# Patient Record
Sex: Female | Born: 1975 | Race: White | Hispanic: No | Marital: Single | Smoking: Never smoker
Health system: Southern US, Community
[De-identification: ages and names within clinical notes are randomized; demographics above are authoritative.]

## PROBLEM LIST (undated history)

## (undated) ENCOUNTER — Inpatient Hospital Stay (HOSPITAL_COMMUNITY): Payer: Self-pay

## (undated) DIAGNOSIS — Z789 Other specified health status: Secondary | ICD-10-CM

## (undated) HISTORY — PX: OTHER SURGICAL HISTORY: SHX169

---

## 2011-08-11 ENCOUNTER — Encounter: Payer: Self-pay | Admitting: Physician Assistant

## 2011-09-11 ENCOUNTER — Encounter: Payer: Self-pay | Admitting: Physician Assistant

## 2014-08-08 LAB — OB RESULTS CONSOLE HGB/HCT, BLOOD: HEMOGLOBIN: 10.5 g/dL

## 2014-08-08 LAB — OB RESULTS CONSOLE RPR: RPR: NONREACTIVE

## 2014-08-08 LAB — OB RESULTS CONSOLE HEPATITIS B SURFACE ANTIGEN: Hepatitis B Surface Ag: NEGATIVE

## 2014-08-08 LAB — OB RESULTS CONSOLE HIV ANTIBODY (ROUTINE TESTING): HIV: NONREACTIVE

## 2014-08-08 LAB — OB RESULTS CONSOLE ABO/RH: RH Type: POSITIVE

## 2014-09-04 ENCOUNTER — Other Ambulatory Visit (HOSPITAL_COMMUNITY): Payer: Self-pay | Admitting: Specialist

## 2014-09-04 DIAGNOSIS — O09292 Supervision of pregnancy with other poor reproductive or obstetric history, second trimester: Secondary | ICD-10-CM

## 2014-09-06 ENCOUNTER — Encounter (HOSPITAL_COMMUNITY): Payer: Self-pay | Admitting: *Deleted

## 2014-09-06 ENCOUNTER — Inpatient Hospital Stay (HOSPITAL_COMMUNITY)
Admission: AD | Admit: 2014-09-06 | Discharge: 2014-09-07 | Disposition: A | Discharge status: Home / Self Care | Source: Ambulatory Visit | Attending: Obstetrics & Gynecology | Admitting: Obstetrics & Gynecology

## 2014-09-06 DIAGNOSIS — F1990 Other psychoactive substance use, unspecified, uncomplicated: Secondary | ICD-10-CM | POA: Diagnosis present

## 2014-09-06 DIAGNOSIS — Z3A24 24 weeks gestation of pregnancy: Secondary | ICD-10-CM | POA: Diagnosis not present

## 2014-09-06 DIAGNOSIS — G4700 Insomnia, unspecified: Secondary | ICD-10-CM | POA: Diagnosis not present

## 2014-09-06 DIAGNOSIS — O99320 Drug use complicating pregnancy, unspecified trimester: Secondary | ICD-10-CM

## 2014-09-06 DIAGNOSIS — F1120 Opioid dependence, uncomplicated: Secondary | ICD-10-CM | POA: Diagnosis not present

## 2014-09-06 DIAGNOSIS — O99322 Drug use complicating pregnancy, second trimester: Secondary | ICD-10-CM | POA: Diagnosis not present

## 2014-09-06 DIAGNOSIS — Z3490 Encounter for supervision of normal pregnancy, unspecified, unspecified trimester: Secondary | ICD-10-CM

## 2014-09-06 LAB — RAPID URINE DRUG SCREEN, HOSP PERFORMED
Amphetamines: NOT DETECTED
Barbiturates: NOT DETECTED
Benzodiazepines: POSITIVE — AB
COCAINE: NOT DETECTED
Opiates: POSITIVE — AB
TETRAHYDROCANNABINOL: NOT DETECTED

## 2014-09-06 LAB — URINALYSIS, ROUTINE W REFLEX MICROSCOPIC
Bilirubin Urine: NEGATIVE
Glucose, UA: NEGATIVE mg/dL
Hgb urine dipstick: NEGATIVE
Ketones, ur: NEGATIVE mg/dL
LEUKOCYTES UA: NEGATIVE
NITRITE: NEGATIVE
Protein, ur: NEGATIVE mg/dL
SPECIFIC GRAVITY, URINE: 1.02 (ref 1.005–1.030)
Urobilinogen, UA: 1 mg/dL (ref 0.0–1.0)
pH: 7 (ref 5.0–8.0)

## 2014-09-06 NOTE — MAU Note (Signed)
Pt states she was sent over for evaluation and to see 'what protocol to use" because she is coming off of mulitple drugs Klonipin xanax

## 2014-09-06 NOTE — Discharge Instructions (Signed)
Risks of Alcohol Use in Pregnancy Alcohol is the biggest cause of mental retardation in babies. Women who are pregnant or who may become pregnant should not drink alcohol. This will lessen the chance of giving birth to a baby with any of the harmful effects of FASD (Fetal Alcohol Spectrum Disorders). FASD is the full spectrum of birth defects caused by drinking while pregnant. The spectrum may include mild changes, such as a slight learning disability. Or it could be full FAS (Fetal Alcohol Syndrome). FAS is a permanent condition that can include: severe learning disabilities, decreased growth (growth deficiencies), abnormal facial features, brain (central nervous system) disorders, and behavior problems. It is unsafe to drink any amount of alcohol when pregnant. When the mother drinks alcohol, so does the baby. The amount of alcohol in the mother's blood shows up as the same amount found in the baby's blood. Alcohol is digested by the mature liver in the mother, but the liver is not mature in the baby. As a result, alcohol affects the baby much more than it affects the mother. The American College of Obstetricians and Gynecologists recommend the safest way to prevent problems to the baby is to not drink any amount of alcohol when pregnant. One of the most preventable causes of a baby developing FASD, especially FAS, is drinking alcohol. FAS is a lifelong physically and mentally disabling condition. The safest thing to do is not drink alcohol when pregnant. RISKS  Alcohol consumed during pregnancy increases the risk of alcohol related birth defects. These include:  Central nervous system disorders.  Decreased growth.  Delayed intellectual development.  Behavioral disorders.  Abnormal facial features.  Mental retardation.  Sleep and sucking disorders.  Children with FAS are at risk of:  Having psychiatric problems.  Criminal behavior.  Unemployment.  Not finishing their education.  No  amount of alcohol can be considered safe during pregnancy.  Alcohol can damage a fetus at any stage of pregnancy. Damage can occur in the early weeks of pregnancy. This is even before a woman knows that she is pregnant.  The learning (cognitive) and behavior problems due to prenatal alcohol exposure are lifelong.  Alcohol-related birth defects are completely preventable.  There is an increased risk of miscarriage and fetal death.  Fetal growth retardation.  Increase in accidents at home, away, and driving.  Drinking alcohol can easily lead to abuse of drugs and smoking. SYMPTOMS  Drinking alcohol slows down the function of your body and brain. It negatively affects your:  Walking (unstable).  Hearing.  Vision.  Daily activities.  Driving.  Mind and memory.  Talking (slurred speech).  It contributes to bad diet and nutrition, which are important for the growth and development of the baby.  In time, it will also cause problems with your health, your relationship with your family, friends, and work. DIAGNOSIS   Do not wait for symptoms to develop to make the diagnosis.  At the first prenatal office visit, the caregiver and patient should discuss alcohol drinking when pregnant.  If the mother is drinking alcohol, both the caregiver and patient should make a serious attempt to stop the drinking completely. TREATMENT   The best treatment is to not drink or stop drinking.  If someone needs help to stop drinking, she should contact the local Alcoholics Anonymous chapter. Or contact a treatment center. The Substance Abuse and Mental Health Services (SAMHSA) can help people find local centers in their area.  Intervention by the spouse, family, and friends.    Counseling by pastor, psychologist, or psychiatrist.  Hospitalization may be necessary.  If the spouse drinks alcohol, he should stop. HOME CARE INSTRUCTIONS   A woman should not drink alcohol while she is  pregnant.  A pregnant woman who has already consumed alcohol during her pregnancy should stop immediately. This will reduce further risk to the baby.  A woman who is considering becoming pregnant should not drink alcohol.  Women of child-bearing age should talk to their caregivers about this issue. Then take steps to reduce the chance of drinking alcohol while pregnant.  The father plays an important role in helping the mother abstain from drinking alcohol. He can encourage her to abstain and should abstain himself.  Avoiding social events where alcohol is served will help, too.  If you think your baby has FASD or FAS, call your caregiver. FOR MORE INFORMATION: The National Institute on Alcohol Abuse and Alcoholism, NIH: www.niaaa.nih.gov The Substance Abuse and Mental Health Services Administration: www.fascenter.samhsa.gov Document Released: 06/15/2007 Document Revised: 11/10/2011 Document Reviewed: 10/20/2013 Exit[employer] Patient Information 2015 [employer]. This information is not intended to replace advice given to you by your health care provider. Make sure you discuss any questions you have with your health care provider.  

## 2014-09-06 NOTE — MAU Note (Signed)
Pt presents in custody of [facility]. States she is about 24 weeks preg and has had drug use and was told she needed to come here for medical eval. Denies pain or bleeding

## 2014-09-06 NOTE — MAU Provider Note (Signed)
History     CSN: [id number]  Arrival date and time: 09/06/14 2042   First Provider Initiated Contact with Patient 09/06/14 2209      Chief Complaint  Patient presents with  . High Risk Gestation   HPI Comments: Tara Hoffman 39 y.o. G6P4 [id number] presents to MAU for advice on drug addiction , management and withdrawal. [facility] She is known to use IV heroin, xanax, and klonopin. Her last use was xanax and that was on Friday. She is complaining of inability to sleep and  Scratching at her skin. No complaints with pregnancy. [place] [facility] and had an ultrasound and knows she is having a boy.     No past medical history on file.  No past surgical history on file.  No family history on file.  History  Substance Use Topics  . Smoking status: Not on file  . Smokeless tobacco: Not on file  . Alcohol Use: Not on file    Allergies: Not on File  Prescriptions prior to admission  Medication Sig Dispense Refill Last Dose  . acetaminophen-codeine (TYLENOL #3) 300-30 MG per tablet Take 2 tablets by mouth 3 (three) times daily.   09/06/2014 at Unknown time  . ALPRAZolam (XANAX) 1 MG tablet Take 1 mg by mouth 4 (four) times daily as needed for anxiety.   Past Week at Unknown time  . clonazePAM (KLONOPIN) 1 MG tablet Take 1 mg by mouth 4 (four) times daily as needed for anxiety.   Past Week at Unknown time  . Prenatal Vit-Fe Fumarate-FA (PRENATAL MULTIVITAMIN) TABS tablet Take 1 tablet by mouth daily at 12 noon.   09/06/2014 at Unknown time    Review of Systems  Constitutional: Negative.   HENT: Negative.   Eyes: Negative.   Respiratory: Negative.   Cardiovascular: Negative.   Gastrointestinal: Negative.   Genitourinary: Negative.   Musculoskeletal: Negative.   Skin:       Scratches from anxiety  Neurological: Negative.   Psychiatric/Behavioral: The patient has insomnia.    Physical Exam   Blood pressure 112/68, pulse  65, temperature 98.6 F (37 C), temperature source Oral, resp. rate 18, height 5\' 6"  (1.676 m), weight 63.957 kg (141 lb), SpO2 98 %.  Physical Exam  Constitutional: She is oriented to person, place, and time. She appears well-developed and well-nourished. No distress.  She is lying quietly without any distress  HENT:  Head: Normocephalic and atraumatic.  Eyes: Pupils are equal, round, and reactive to light.  Cardiovascular: Normal rate, regular rhythm and normal heart sounds.   Respiratory: Effort normal and breath sounds normal. No respiratory distress. She has no wheezes. She has no rales.  GI: Soft. Bowel sounds are normal. She exhibits no distension. There is no tenderness. There is no rebound.  Musculoskeletal: Normal range of motion.  Neurological: She is alert and oriented to person, place, and time.  Skin: Skin is warm.  Few old scratches on arms and knees  Psychiatric: She has a normal mood and affect. Her behavior is normal. Judgment and thought content normal.   TOCO: rate 140/ no contractions  Results for orders placed or performed during the hospital encounter of 09/06/14 (from the past 24 hour(s))  Urinalysis, Routine w reflex microscopic     Status: None   Collection Time: 09/06/14  8:55 PM  Result Value Ref Range   Color, Urine YELLOW YELLOW   APPearance CLEAR CLEAR   Specific  Gravity, Urine 1.020 1.005 - 1.030   pH 7.0 5.0 - 8.0   Glucose, UA NEGATIVE NEGATIVE mg/dL   Hgb urine dipstick NEGATIVE NEGATIVE   Bilirubin Urine NEGATIVE NEGATIVE   Ketones, ur NEGATIVE NEGATIVE mg/dL   Protein, ur NEGATIVE NEGATIVE mg/dL   Urobilinogen, UA 1.0 0.0 - 1.0 mg/dL   Nitrite NEGATIVE NEGATIVE   Leukocytes, UA NEGATIVE NEGATIVE    MAU Course  Procedures  MDM  Spoke with Dr Despina Hidden who advised we do not manage addiction or drug withdrawal in ED UDS done/ results pending Assessment and Plan   A: Drug use in pregnancy  P: Advised we can not offer this service Advised  if her last xanax was Friday it is out of her system Advised Zoloft for anxiety/ needs to have prescribed by her OB Return to MAU as needed   Carolynn Serve 09/06/2014, 10:55 PM

## 2014-09-15 ENCOUNTER — Ambulatory Visit (HOSPITAL_COMMUNITY)
Admission: RE | Admit: 2014-09-15 | Discharge: 2014-09-15 | Disposition: A | Discharge status: Home / Self Care | Payer: Medicaid Other | Source: Ambulatory Visit | Attending: Specialist | Admitting: Specialist

## 2014-09-15 ENCOUNTER — Encounter (HOSPITAL_COMMUNITY): Payer: Self-pay

## 2014-09-15 ENCOUNTER — Other Ambulatory Visit (HOSPITAL_COMMUNITY): Payer: Self-pay | Admitting: Specialist

## 2014-09-15 ENCOUNTER — Ambulatory Visit (HOSPITAL_COMMUNITY): Payer: Self-pay

## 2014-09-15 DIAGNOSIS — Z36 Encounter for antenatal screening of mother: Secondary | ICD-10-CM | POA: Diagnosis present

## 2014-09-15 DIAGNOSIS — Z3A27 27 weeks gestation of pregnancy: Secondary | ICD-10-CM

## 2014-09-15 DIAGNOSIS — O0932 Supervision of pregnancy with insufficient antenatal care, second trimester: Secondary | ICD-10-CM | POA: Diagnosis not present

## 2014-09-15 DIAGNOSIS — O99322 Drug use complicating pregnancy, second trimester: Secondary | ICD-10-CM | POA: Insufficient documentation

## 2014-09-15 DIAGNOSIS — O09292 Supervision of pregnancy with other poor reproductive or obstetric history, second trimester: Secondary | ICD-10-CM | POA: Insufficient documentation

## 2014-09-15 DIAGNOSIS — O09299 Supervision of pregnancy with other poor reproductive or obstetric history, unspecified trimester: Secondary | ICD-10-CM | POA: Insufficient documentation

## 2014-09-15 DIAGNOSIS — Z3689 Encounter for other specified antenatal screening: Secondary | ICD-10-CM | POA: Insufficient documentation

## 2014-10-24 ENCOUNTER — Other Ambulatory Visit (HOSPITAL_COMMUNITY): Payer: Self-pay | Admitting: Maternal and Fetal Medicine

## 2014-10-24 DIAGNOSIS — O99323 Drug use complicating pregnancy, third trimester: Secondary | ICD-10-CM

## 2014-10-24 DIAGNOSIS — O0933 Supervision of pregnancy with insufficient antenatal care, third trimester: Secondary | ICD-10-CM

## 2014-10-24 DIAGNOSIS — O09293 Supervision of pregnancy with other poor reproductive or obstetric history, third trimester: Secondary | ICD-10-CM

## 2014-10-24 DIAGNOSIS — Z87898 Personal history of other specified conditions: Secondary | ICD-10-CM

## 2014-10-24 DIAGNOSIS — F1920 Other psychoactive substance dependence, uncomplicated: Secondary | ICD-10-CM

## 2014-10-27 ENCOUNTER — Other Ambulatory Visit (HOSPITAL_COMMUNITY): Payer: Self-pay | Admitting: Maternal and Fetal Medicine

## 2014-10-27 ENCOUNTER — Ambulatory Visit (HOSPITAL_COMMUNITY)
Admission: RE | Admit: 2014-10-27 | Discharge: 2014-10-27 | Disposition: A | Discharge status: Home / Self Care | Source: Ambulatory Visit | Attending: Specialist | Admitting: Specialist

## 2014-10-27 ENCOUNTER — Encounter (HOSPITAL_COMMUNITY): Payer: Self-pay

## 2014-10-27 DIAGNOSIS — O09523 Supervision of elderly multigravida, third trimester: Secondary | ICD-10-CM

## 2014-10-27 DIAGNOSIS — O09299 Supervision of pregnancy with other poor reproductive or obstetric history, unspecified trimester: Secondary | ICD-10-CM | POA: Insufficient documentation

## 2014-10-27 DIAGNOSIS — O0933 Supervision of pregnancy with insufficient antenatal care, third trimester: Secondary | ICD-10-CM | POA: Insufficient documentation

## 2014-10-27 DIAGNOSIS — F1920 Other psychoactive substance dependence, uncomplicated: Secondary | ICD-10-CM | POA: Insufficient documentation

## 2014-10-27 DIAGNOSIS — O99323 Drug use complicating pregnancy, third trimester: Secondary | ICD-10-CM | POA: Diagnosis not present

## 2014-10-27 DIAGNOSIS — Z36 Encounter for antenatal screening of mother: Secondary | ICD-10-CM | POA: Insufficient documentation

## 2014-10-27 DIAGNOSIS — O0930 Supervision of pregnancy with insufficient antenatal care, unspecified trimester: Secondary | ICD-10-CM | POA: Insufficient documentation

## 2014-10-27 DIAGNOSIS — O09293 Supervision of pregnancy with other poor reproductive or obstetric history, third trimester: Secondary | ICD-10-CM | POA: Insufficient documentation

## 2014-10-27 DIAGNOSIS — Z0489 Encounter for examination and observation for other specified reasons: Secondary | ICD-10-CM | POA: Insufficient documentation

## 2014-10-27 DIAGNOSIS — Z3A31 31 weeks gestation of pregnancy: Secondary | ICD-10-CM | POA: Insufficient documentation

## 2014-10-27 DIAGNOSIS — IMO0002 Reserved for concepts with insufficient information to code with codable children: Secondary | ICD-10-CM | POA: Insufficient documentation

## 2014-10-27 DIAGNOSIS — Z87898 Personal history of other specified conditions: Secondary | ICD-10-CM | POA: Insufficient documentation

## 2014-10-27 DIAGNOSIS — O99320 Drug use complicating pregnancy, unspecified trimester: Secondary | ICD-10-CM

## 2014-10-27 HISTORY — DX: Other specified health status: Z78.9

## 2014-10-31 ENCOUNTER — Encounter (HOSPITAL_COMMUNITY): Payer: Self-pay | Admitting: Maternal and Fetal Medicine

## 2014-10-31 ENCOUNTER — Other Ambulatory Visit (HOSPITAL_COMMUNITY): Payer: Self-pay | Admitting: Specialist

## 2014-11-03 ENCOUNTER — Encounter (HOSPITAL_COMMUNITY): Payer: Self-pay

## 2014-11-03 ENCOUNTER — Ambulatory Visit (HOSPITAL_COMMUNITY)
Admission: RE | Admit: 2014-11-03 | Discharge: 2014-11-03 | Disposition: A | Discharge status: Home / Self Care | Source: Ambulatory Visit | Attending: Specialist | Admitting: Specialist

## 2014-11-03 DIAGNOSIS — O99320 Drug use complicating pregnancy, unspecified trimester: Secondary | ICD-10-CM | POA: Insufficient documentation

## 2014-11-03 DIAGNOSIS — O99323 Drug use complicating pregnancy, third trimester: Secondary | ICD-10-CM | POA: Insufficient documentation

## 2014-11-03 DIAGNOSIS — Z3A32 32 weeks gestation of pregnancy: Secondary | ICD-10-CM | POA: Insufficient documentation

## 2014-11-03 DIAGNOSIS — O09523 Supervision of elderly multigravida, third trimester: Secondary | ICD-10-CM | POA: Insufficient documentation

## 2014-11-03 DIAGNOSIS — O09299 Supervision of pregnancy with other poor reproductive or obstetric history, unspecified trimester: Secondary | ICD-10-CM | POA: Insufficient documentation

## 2014-11-03 DIAGNOSIS — O0933 Supervision of pregnancy with insufficient antenatal care, third trimester: Secondary | ICD-10-CM | POA: Insufficient documentation

## 2014-11-03 DIAGNOSIS — O09529 Supervision of elderly multigravida, unspecified trimester: Secondary | ICD-10-CM | POA: Insufficient documentation

## 2014-11-03 DIAGNOSIS — O09293 Supervision of pregnancy with other poor reproductive or obstetric history, third trimester: Secondary | ICD-10-CM

## 2014-11-06 ENCOUNTER — Other Ambulatory Visit (HOSPITAL_COMMUNITY): Payer: Self-pay | Admitting: Specialist

## 2014-11-10 ENCOUNTER — Ambulatory Visit (HOSPITAL_COMMUNITY)
Admission: RE | Admit: 2014-11-10 | Discharge: 2014-11-10 | Disposition: A | Discharge status: Home / Self Care | Source: Ambulatory Visit | Attending: Specialist | Admitting: Specialist

## 2014-11-10 ENCOUNTER — Encounter (HOSPITAL_COMMUNITY): Payer: Self-pay

## 2014-11-10 DIAGNOSIS — O09523 Supervision of elderly multigravida, third trimester: Secondary | ICD-10-CM | POA: Diagnosis not present

## 2014-11-10 DIAGNOSIS — Z3A33 33 weeks gestation of pregnancy: Secondary | ICD-10-CM | POA: Insufficient documentation

## 2014-11-10 DIAGNOSIS — O0933 Supervision of pregnancy with insufficient antenatal care, third trimester: Secondary | ICD-10-CM

## 2014-11-10 DIAGNOSIS — O09299 Supervision of pregnancy with other poor reproductive or obstetric history, unspecified trimester: Secondary | ICD-10-CM | POA: Insufficient documentation

## 2014-11-10 DIAGNOSIS — O99323 Drug use complicating pregnancy, third trimester: Secondary | ICD-10-CM | POA: Diagnosis not present

## 2014-11-10 DIAGNOSIS — O09293 Supervision of pregnancy with other poor reproductive or obstetric history, third trimester: Secondary | ICD-10-CM | POA: Diagnosis not present

## 2014-11-10 DIAGNOSIS — O09529 Supervision of elderly multigravida, unspecified trimester: Secondary | ICD-10-CM | POA: Insufficient documentation

## 2014-11-17 ENCOUNTER — Encounter (HOSPITAL_COMMUNITY): Payer: Self-pay

## 2014-11-17 ENCOUNTER — Ambulatory Visit (HOSPITAL_COMMUNITY)
Admission: RE | Admit: 2014-11-17 | Discharge: 2014-11-17 | Disposition: A | Discharge status: Home / Self Care | Source: Ambulatory Visit | Attending: Specialist | Admitting: Specialist

## 2014-11-17 DIAGNOSIS — O99323 Drug use complicating pregnancy, third trimester: Secondary | ICD-10-CM

## 2014-11-17 DIAGNOSIS — O09299 Supervision of pregnancy with other poor reproductive or obstetric history, unspecified trimester: Secondary | ICD-10-CM | POA: Insufficient documentation

## 2014-11-17 DIAGNOSIS — Z3A34 34 weeks gestation of pregnancy: Secondary | ICD-10-CM | POA: Diagnosis not present

## 2014-11-17 DIAGNOSIS — O09293 Supervision of pregnancy with other poor reproductive or obstetric history, third trimester: Secondary | ICD-10-CM | POA: Insufficient documentation

## 2014-11-17 DIAGNOSIS — O09523 Supervision of elderly multigravida, third trimester: Secondary | ICD-10-CM | POA: Insufficient documentation

## 2014-11-17 DIAGNOSIS — Z36 Encounter for antenatal screening of mother: Secondary | ICD-10-CM | POA: Insufficient documentation

## 2014-11-17 DIAGNOSIS — O0933 Supervision of pregnancy with insufficient antenatal care, third trimester: Secondary | ICD-10-CM

## 2014-11-24 ENCOUNTER — Ambulatory Visit (HOSPITAL_COMMUNITY)
Admission: RE | Admit: 2014-11-24 | Discharge: 2014-11-24 | Disposition: A | Discharge status: Home / Self Care | Source: Ambulatory Visit | Attending: Specialist | Admitting: Specialist

## 2014-11-24 ENCOUNTER — Other Ambulatory Visit (HOSPITAL_COMMUNITY): Payer: Self-pay | Admitting: Maternal and Fetal Medicine

## 2014-11-24 DIAGNOSIS — O09293 Supervision of pregnancy with other poor reproductive or obstetric history, third trimester: Secondary | ICD-10-CM | POA: Insufficient documentation

## 2014-11-24 DIAGNOSIS — O99323 Drug use complicating pregnancy, third trimester: Secondary | ICD-10-CM | POA: Diagnosis not present

## 2014-11-24 DIAGNOSIS — O09523 Supervision of elderly multigravida, third trimester: Secondary | ICD-10-CM

## 2014-11-24 DIAGNOSIS — O0933 Supervision of pregnancy with insufficient antenatal care, third trimester: Secondary | ICD-10-CM | POA: Diagnosis not present

## 2014-11-24 DIAGNOSIS — Z36 Encounter for antenatal screening of mother: Secondary | ICD-10-CM | POA: Diagnosis present

## 2014-11-24 DIAGNOSIS — Z3A35 35 weeks gestation of pregnancy: Secondary | ICD-10-CM | POA: Insufficient documentation

## 2014-11-28 LAB — OB RESULTS CONSOLE GBS: STREP GROUP B AG: POSITIVE

## 2014-12-01 ENCOUNTER — Ambulatory Visit (HOSPITAL_COMMUNITY)
Admission: RE | Admit: 2014-12-01 | Discharge: 2014-12-01 | Disposition: A | Discharge status: Home / Self Care | Source: Ambulatory Visit | Attending: Specialist | Admitting: Specialist

## 2014-12-01 ENCOUNTER — Encounter (HOSPITAL_COMMUNITY): Payer: Self-pay

## 2014-12-01 DIAGNOSIS — O0933 Supervision of pregnancy with insufficient antenatal care, third trimester: Secondary | ICD-10-CM | POA: Diagnosis not present

## 2014-12-01 DIAGNOSIS — Z3A36 36 weeks gestation of pregnancy: Secondary | ICD-10-CM | POA: Insufficient documentation

## 2014-12-01 DIAGNOSIS — O99323 Drug use complicating pregnancy, third trimester: Secondary | ICD-10-CM | POA: Diagnosis not present

## 2014-12-01 DIAGNOSIS — Z36 Encounter for antenatal screening of mother: Secondary | ICD-10-CM | POA: Diagnosis present

## 2014-12-01 DIAGNOSIS — O09293 Supervision of pregnancy with other poor reproductive or obstetric history, third trimester: Secondary | ICD-10-CM | POA: Insufficient documentation

## 2014-12-01 DIAGNOSIS — O09523 Supervision of elderly multigravida, third trimester: Secondary | ICD-10-CM

## 2014-12-08 ENCOUNTER — Ambulatory Visit (HOSPITAL_COMMUNITY)
Admission: RE | Admit: 2014-12-08 | Discharge: 2014-12-08 | Disposition: A | Discharge status: Home / Self Care | Source: Ambulatory Visit | Attending: Specialist | Admitting: Specialist

## 2014-12-08 ENCOUNTER — Other Ambulatory Visit (HOSPITAL_COMMUNITY): Payer: Self-pay | Admitting: Maternal and Fetal Medicine

## 2014-12-08 DIAGNOSIS — O09523 Supervision of elderly multigravida, third trimester: Secondary | ICD-10-CM

## 2014-12-08 DIAGNOSIS — O99323 Drug use complicating pregnancy, third trimester: Secondary | ICD-10-CM

## 2014-12-08 DIAGNOSIS — O0933 Supervision of pregnancy with insufficient antenatal care, third trimester: Secondary | ICD-10-CM | POA: Insufficient documentation

## 2014-12-08 DIAGNOSIS — Z3A37 37 weeks gestation of pregnancy: Secondary | ICD-10-CM | POA: Insufficient documentation

## 2014-12-08 DIAGNOSIS — O09293 Supervision of pregnancy with other poor reproductive or obstetric history, third trimester: Secondary | ICD-10-CM | POA: Diagnosis present

## 2014-12-11 ENCOUNTER — Other Ambulatory Visit (HOSPITAL_COMMUNITY): Payer: Self-pay | Admitting: Specialist

## 2014-12-15 ENCOUNTER — Ambulatory Visit (HOSPITAL_COMMUNITY): Admission: RE | Admit: 2014-12-15 | Source: Ambulatory Visit

## 2014-12-15 ENCOUNTER — Ambulatory Visit (HOSPITAL_COMMUNITY)

## 2014-12-22 ENCOUNTER — Ambulatory Visit (HOSPITAL_COMMUNITY): Payer: Medicaid Other

## 2015-07-12 ENCOUNTER — Encounter (HOSPITAL_COMMUNITY): Payer: Self-pay | Admitting: *Deleted

## 2016-05-10 IMAGING — US US OB LIMITED
1 series · 13 of 18 positions shown · non-contrast
Comparison: none

[Series 1: us ob limited · 0.23mm/px · 13 of 18 slices shown]
[im 1/18]
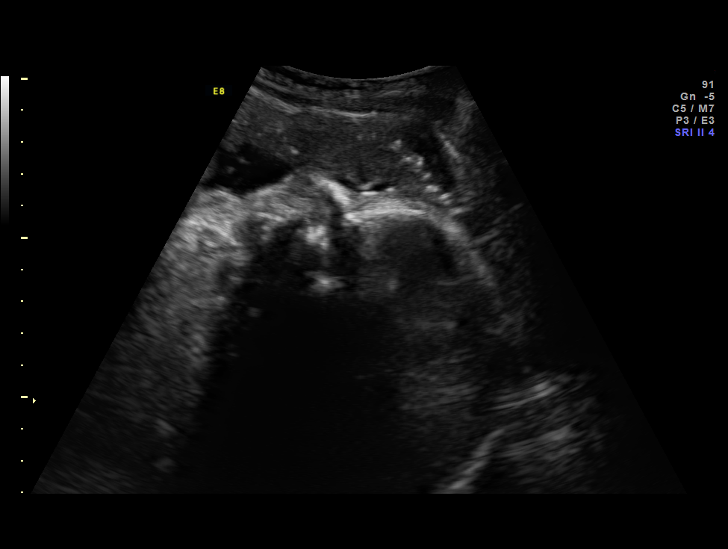
[im 3/18]
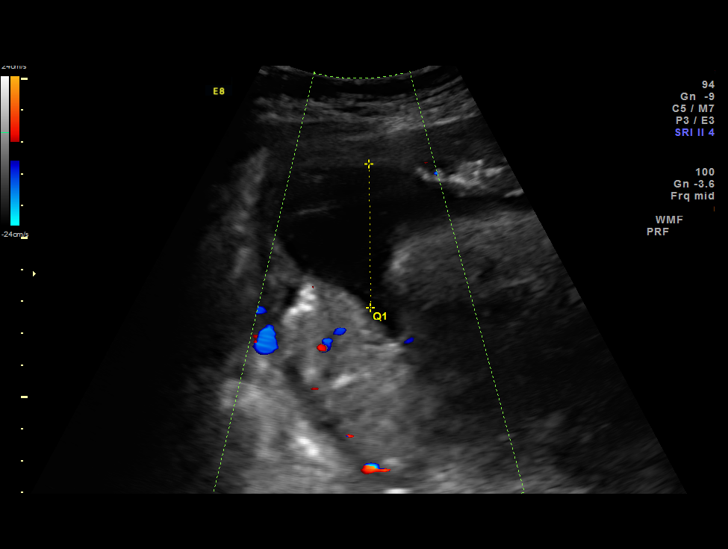
[im 4/18]
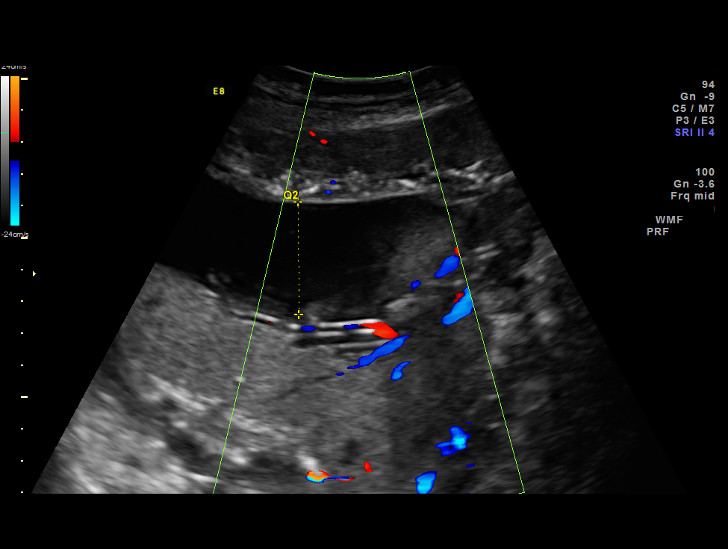
[im 5/18]
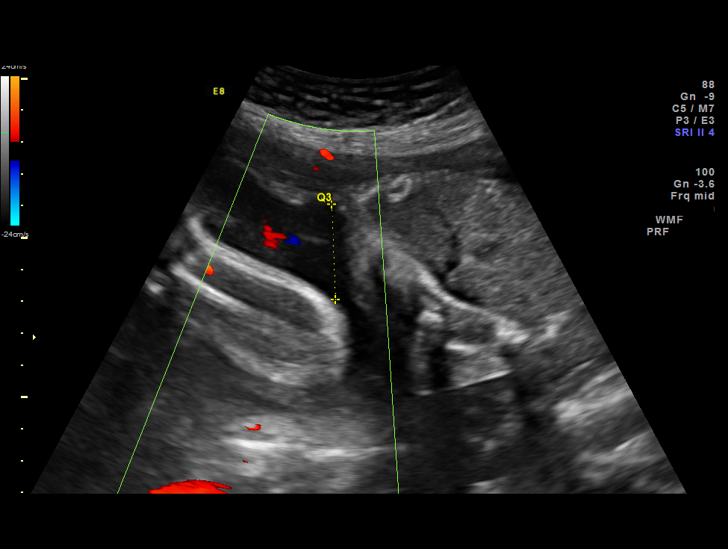
[im 7/18]
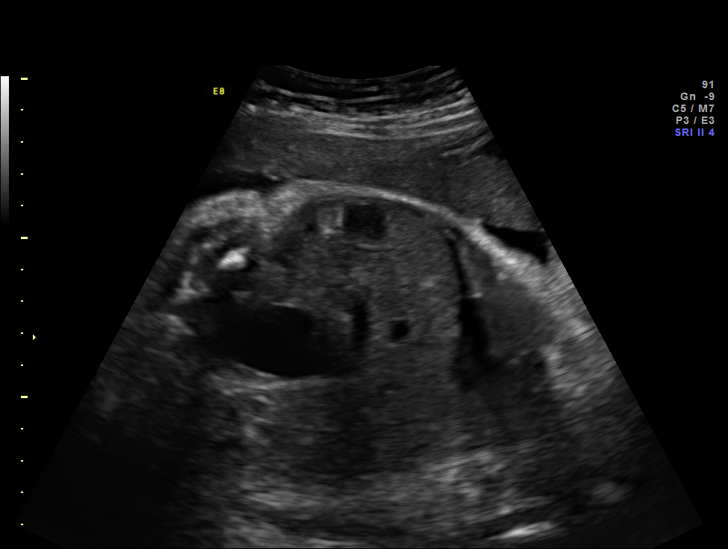
[im 8/18]
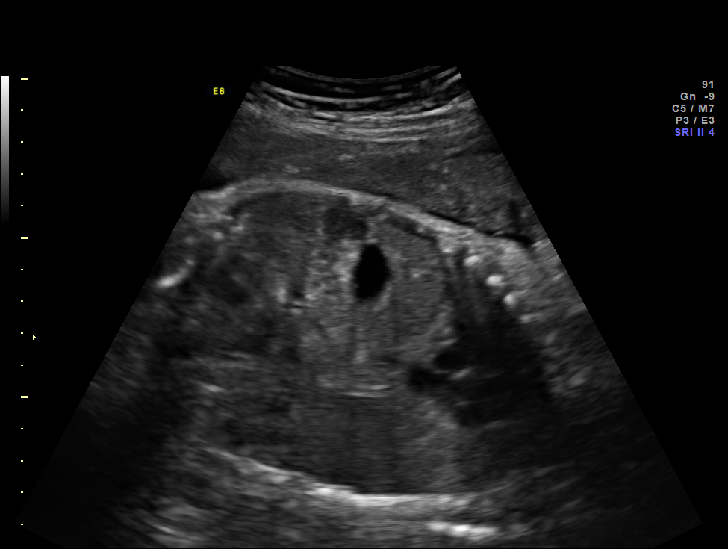
[im 10/18]
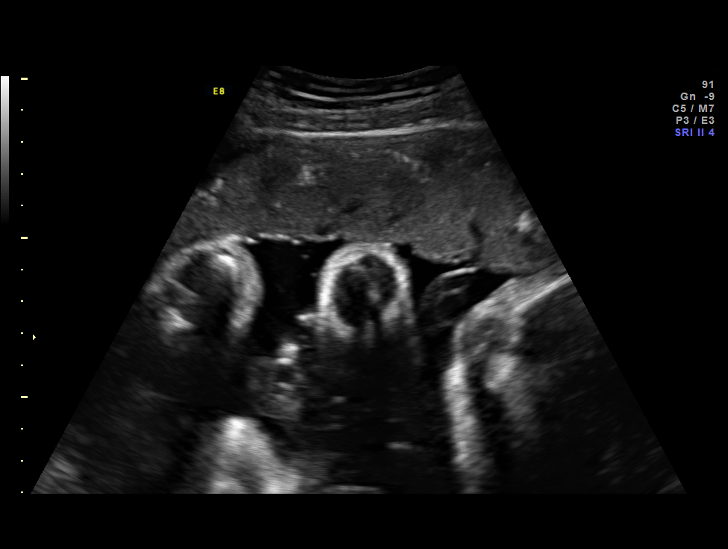
[im 11/18]
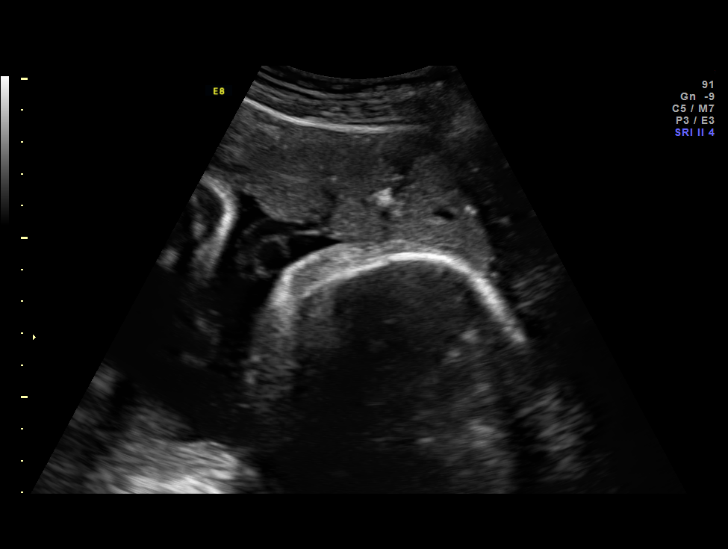
[im 12/18]
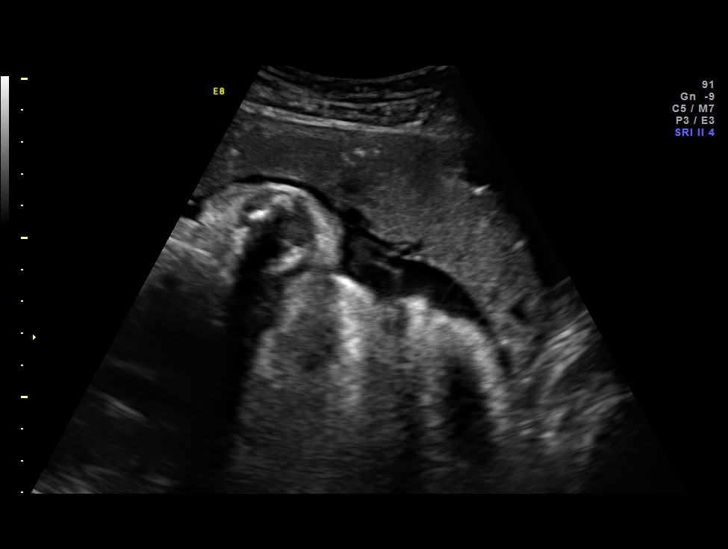
[im 14/18]
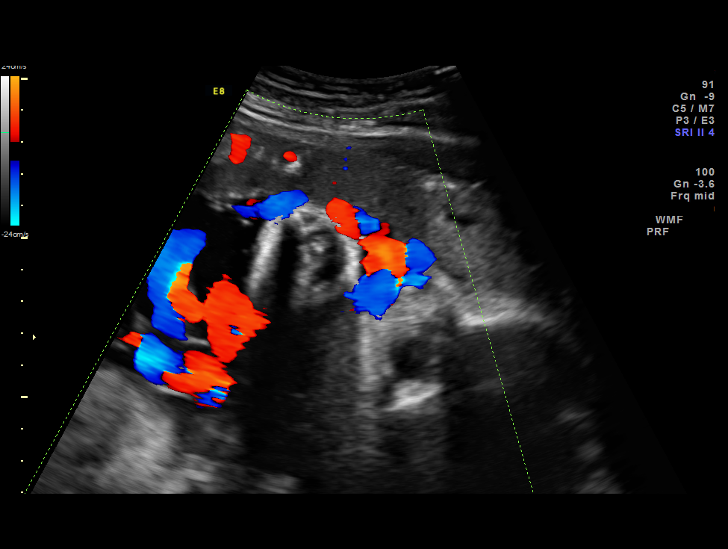
[im 15/18]
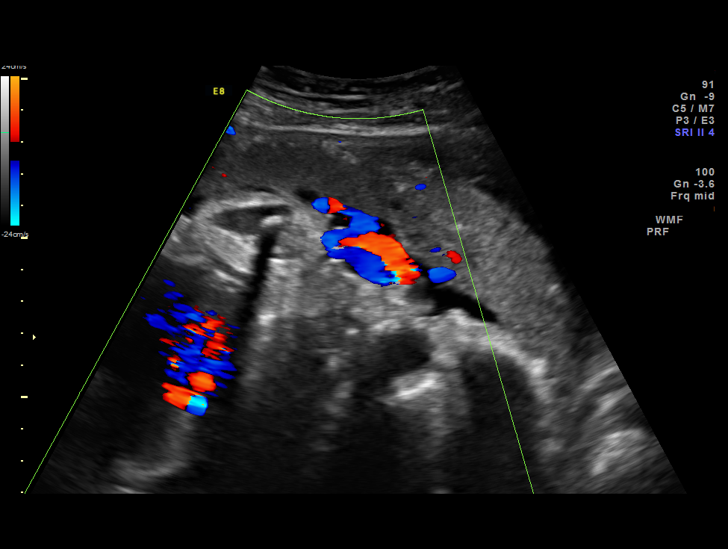
[im 16/18]
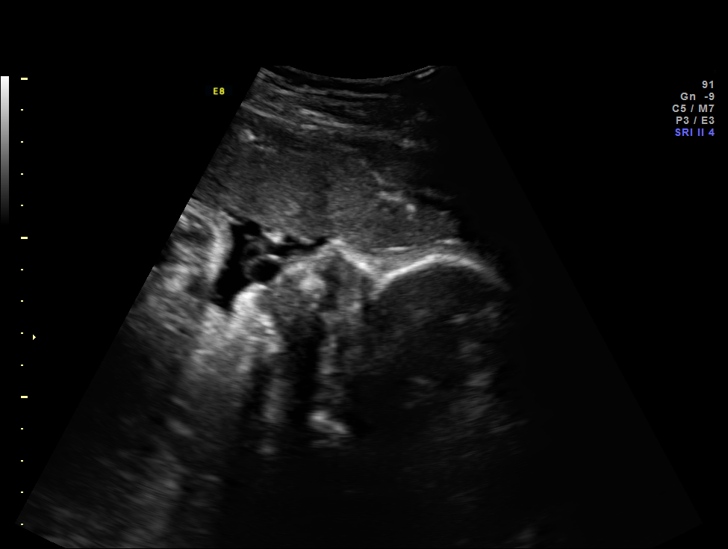
[im 18/18]
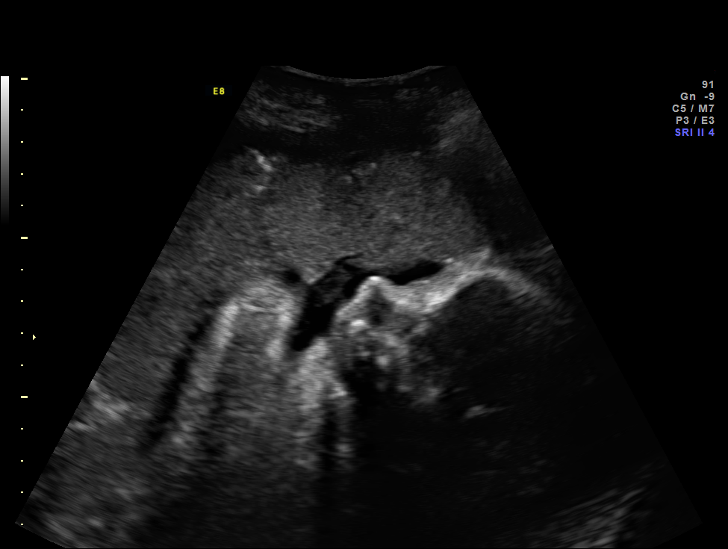

[13 of 18 positions shown; findings below may reference images not displayed]

OBSTETRICS REPORT
                      (Signed Final 11/17/2014 [DATE])

Service(s) Provided

 [HOSPITAL]                                         76815.0
Indications

 34 weeks gestation of pregnancy
 Poor obstetric history: Previous fetal growth
 restriction (FGR)
 Drug dependence complicating
 pregnancy,antepartum condition or complication
 Advanced maternal age multigravida 35+, third
 trimester
 Poor obstetric history: Previous IUFD (stillbirth)
Fetal Evaluation

 Num Of Fetuses:    1
 Fetal Heart Rate:  136                          bpm
 Cardiac Activity:  Observed
 Presentation:      Cephalic
 Placenta:          Anterior, above cervical os
 P. Cord            Visualized, central
 Insertion:

 Amniotic Fluid
 AFI FV:      Subjectively within normal limits
 AFI Sum:     14.4    cm       51  %Tile     Larg Pckt:     4.5  cm
 RUQ:   4.5     cm   RLQ:    3.5    cm    LUQ:   3       cm   LLQ:    3.4    cm
Gestational Age

 LMP:           36w 2d        Date:  03/08/14                 EDD:   12/13/14
 Best:          34w 1d     Det. By:  U/S (09/15/14)           EDD:   12/28/14
Cervix Uterus Adnexa

 Cervix:       Not visualized (advanced GA >51wks)
Impression

 SIUP at 34+1 weeks
 Normal amniotic fluid volume
 NST reactive
Recommendations

 Continue twice weekly NSTs with weekly AFIs
 Growth next week

 questions or concerns.
